# Patient Record
Sex: Female | Born: 1935 | Race: Black or African American | Hispanic: No
Health system: Southern US, Community
[De-identification: ages and names within clinical notes are randomized; demographics above are authoritative.]

---

## 2003-09-25 ENCOUNTER — Other Ambulatory Visit: Payer: Self-pay

## 2004-02-11 ENCOUNTER — Other Ambulatory Visit: Payer: Self-pay

## 2005-06-05 ENCOUNTER — Other Ambulatory Visit: Payer: Self-pay

## 2005-06-05 ENCOUNTER — Emergency Department: Payer: Self-pay | Admitting: Emergency Medicine

## 2005-08-06 ENCOUNTER — Ambulatory Visit: Payer: Self-pay | Admitting: Internal Medicine

## 2005-10-24 ENCOUNTER — Other Ambulatory Visit: Payer: Self-pay

## 2005-10-25 ENCOUNTER — Inpatient Hospital Stay: Payer: Self-pay | Admitting: Internal Medicine

## 2005-11-30 ENCOUNTER — Other Ambulatory Visit: Payer: Self-pay

## 2005-11-30 ENCOUNTER — Inpatient Hospital Stay: Payer: Self-pay | Admitting: Internal Medicine

## 2006-01-04 ENCOUNTER — Other Ambulatory Visit: Payer: Self-pay

## 2006-01-04 ENCOUNTER — Inpatient Hospital Stay: Payer: Self-pay | Admitting: Internal Medicine

## 2006-03-06 ENCOUNTER — Ambulatory Visit: Payer: Self-pay | Admitting: Family Medicine

## 2006-05-01 ENCOUNTER — Ambulatory Visit: Payer: Self-pay | Admitting: Family Medicine

## 2006-05-04 ENCOUNTER — Other Ambulatory Visit: Payer: Self-pay

## 2006-05-04 ENCOUNTER — Inpatient Hospital Stay: Payer: Self-pay | Admitting: Internal Medicine

## 2006-10-04 ENCOUNTER — Ambulatory Visit: Payer: Self-pay | Admitting: Family Medicine

## 2006-10-08 ENCOUNTER — Other Ambulatory Visit: Payer: Self-pay

## 2006-10-08 ENCOUNTER — Inpatient Hospital Stay: Payer: Self-pay | Admitting: *Deleted

## 2006-12-23 ENCOUNTER — Ambulatory Visit: Payer: Self-pay | Admitting: Family Medicine

## 2007-02-03 ENCOUNTER — Emergency Department: Payer: Self-pay | Admitting: Internal Medicine

## 2007-02-03 ENCOUNTER — Ambulatory Visit: Payer: Self-pay | Admitting: Family Medicine

## 2007-02-03 ENCOUNTER — Other Ambulatory Visit: Payer: Self-pay

## 2007-02-04 ENCOUNTER — Ambulatory Visit: Payer: Self-pay | Admitting: Family Medicine

## 2007-06-10 ENCOUNTER — Ambulatory Visit: Payer: Self-pay | Admitting: Family Medicine

## 2007-06-15 ENCOUNTER — Other Ambulatory Visit: Payer: Self-pay

## 2007-06-16 ENCOUNTER — Inpatient Hospital Stay: Payer: Self-pay | Admitting: Internal Medicine

## 2007-08-04 ENCOUNTER — Emergency Department: Payer: Self-pay | Admitting: Emergency Medicine

## 2007-09-09 ENCOUNTER — Emergency Department: Payer: Self-pay | Admitting: Emergency Medicine

## 2007-09-18 ENCOUNTER — Inpatient Hospital Stay: Payer: Self-pay | Admitting: Internal Medicine

## 2007-09-19 ENCOUNTER — Other Ambulatory Visit: Payer: Self-pay

## 2008-09-12 ENCOUNTER — Inpatient Hospital Stay: Payer: Self-pay | Admitting: Internal Medicine

## 2009-04-26 ENCOUNTER — Inpatient Hospital Stay: Payer: Self-pay | Admitting: Internal Medicine

## 2009-06-21 ENCOUNTER — Ambulatory Visit: Payer: Self-pay | Admitting: Family Medicine

## 2011-04-26 ENCOUNTER — Emergency Department: Payer: Self-pay | Admitting: Emergency Medicine

## 2011-11-06 ENCOUNTER — Ambulatory Visit: Payer: Self-pay | Admitting: Internal Medicine

## 2011-11-08 ENCOUNTER — Emergency Department: Payer: Self-pay | Admitting: Surgery

## 2011-11-13 ENCOUNTER — Inpatient Hospital Stay: Payer: Self-pay | Admitting: Internal Medicine

## 2011-11-13 LAB — COMPREHENSIVE METABOLIC PANEL
Anion Gap: 13 (ref 7–16)
Calcium, Total: 8.2 mg/dL — ABNORMAL LOW (ref 8.5–10.1)
Chloride: 101 mmol/L (ref 98–107)
Co2: 19 mmol/L — ABNORMAL LOW (ref 21–32)
Creatinine: 5.55 mg/dL — ABNORMAL HIGH (ref 0.60–1.30)
EGFR (African American): 10 — ABNORMAL LOW
EGFR (Non-African Amer.): 8 — ABNORMAL LOW
Potassium: 5.5 mmol/L — ABNORMAL HIGH (ref 3.5–5.1)
SGOT(AST): 34 U/L (ref 15–37)
SGPT (ALT): 24 U/L

## 2011-11-13 LAB — CBC
HCT: 26.1 % — ABNORMAL LOW (ref 35.0–47.0)
HGB: 8.5 g/dL — ABNORMAL LOW (ref 12.0–16.0)
MCH: 31.4 pg (ref 26.0–34.0)
MCV: 97 fL (ref 80–100)
RBC: 2.71 10*6/uL — ABNORMAL LOW (ref 3.80–5.20)
WBC: 6.9 10*3/uL (ref 3.6–11.0)

## 2011-11-13 LAB — TROPONIN I: Troponin-I: 0.04 ng/mL

## 2011-11-13 LAB — PROTIME-INR: INR: 1.2

## 2011-11-14 LAB — COMPREHENSIVE METABOLIC PANEL
Albumin: 3 g/dL — ABNORMAL LOW (ref 3.4–5.0)
BUN: 82 mg/dL — ABNORMAL HIGH (ref 7–18)
Bilirubin,Total: 0.2 mg/dL (ref 0.2–1.0)
Chloride: 104 mmol/L (ref 98–107)
Co2: 19 mmol/L — ABNORMAL LOW (ref 21–32)
Creatinine: 5.04 mg/dL — ABNORMAL HIGH (ref 0.60–1.30)
EGFR (African American): 11 — ABNORMAL LOW
EGFR (Non-African Amer.): 9 — ABNORMAL LOW
Glucose: 76 mg/dL (ref 65–99)
SGOT(AST): 35 U/L (ref 15–37)
SGPT (ALT): 25 U/L
Total Protein: 6.5 g/dL (ref 6.4–8.2)

## 2011-11-14 LAB — TROPONIN I
Troponin-I: 0.3 ng/mL — ABNORMAL HIGH
Troponin-I: 0.21 ng/mL — ABNORMAL HIGH

## 2011-11-14 LAB — BASIC METABOLIC PANEL
Anion Gap: 10 (ref 7–16)
BUN: 73 mg/dL — ABNORMAL HIGH (ref 7–18)
Calcium, Total: 7.8 mg/dL — ABNORMAL LOW (ref 8.5–10.1)
Co2: 23 mmol/L (ref 21–32)
Creatinine: 4.34 mg/dL — ABNORMAL HIGH (ref 0.60–1.30)
EGFR (African American): 13 — ABNORMAL LOW
Osmolality: 295 (ref 275–301)

## 2011-11-14 LAB — CBC WITH DIFFERENTIAL/PLATELET
Basophil %: 0.1 %
Eosinophil #: 0.1 10*3/uL (ref 0.0–0.7)
Eosinophil %: 1.2 %
HGB: 8.6 g/dL — ABNORMAL LOW (ref 12.0–16.0)
Lymphocyte #: 1.1 10*3/uL (ref 1.0–3.6)
MCH: 31.3 pg (ref 26.0–34.0)
MCHC: 32.6 g/dL (ref 32.0–36.0)
MCV: 96 fL (ref 80–100)
Monocyte #: 0.5 10*3/uL (ref 0.0–0.7)
Monocyte %: 7.4 %
Neutrophil #: 4.6 10*3/uL (ref 1.4–6.5)
Neutrophil %: 73.7 %
RBC: 2.76 10*6/uL — ABNORMAL LOW (ref 3.80–5.20)
WBC: 6.2 10*3/uL (ref 3.6–11.0)

## 2011-11-14 LAB — URINALYSIS, COMPLETE
Bacteria: NONE SEEN
Glucose,UR: NEGATIVE mg/dL (ref 0–75)
Leukocyte Esterase: NEGATIVE
Nitrite: NEGATIVE
Ph: 5 (ref 4.5–8.0)
Protein: NEGATIVE
RBC,UR: 1 /HPF (ref 0–5)
Squamous Epithelial: 5

## 2011-11-14 LAB — MAGNESIUM: Magnesium: 2.7 mg/dL — ABNORMAL HIGH

## 2011-11-14 LAB — PROTIME-INR
INR: 1.1
Prothrombin Time: 14.6 secs (ref 11.5–14.7)

## 2011-11-14 LAB — SODIUM, URINE, RANDOM: Sodium, Urine Random: 50 mmol/L (ref 20–110)

## 2011-11-14 LAB — CK TOTAL AND CKMB (NOT AT ARMC)
CK, Total: 615 U/L — ABNORMAL HIGH (ref 21–215)
CK-MB: 18.1 ng/mL — ABNORMAL HIGH (ref 0.5–3.6)
CK-MB: 17.1 ng/mL — ABNORMAL HIGH (ref 0.5–3.6)

## 2011-11-14 LAB — IRON AND TIBC
Iron Bind.Cap.(Total): 229 ug/dL — ABNORMAL LOW (ref 250–450)
Iron: 29 ug/dL — ABNORMAL LOW (ref 50–170)

## 2011-11-14 LAB — TSH: Thyroid Stimulating Horm: 0.153 u[IU]/mL — ABNORMAL LOW

## 2011-11-14 LAB — RETICULOCYTES: Reticulocyte: 0.9 % (ref 0.5–1.5)

## 2011-11-14 LAB — PROTEIN / CREATININE RATIO, URINE: Creatinine, Urine: 71.6 mg/dL (ref 30.0–125.0)

## 2011-11-15 LAB — CBC WITH DIFFERENTIAL/PLATELET
Basophil %: 0.2 %
Eosinophil %: 6 %
HGB: 9.1 g/dL — ABNORMAL LOW (ref 12.0–16.0)
Lymphocyte #: 1.1 10*3/uL (ref 1.0–3.6)
Lymphocyte %: 19.3 %
MCH: 30.9 pg (ref 26.0–34.0)
MCV: 95 fL (ref 80–100)
Monocyte #: 0.7 10*3/uL (ref 0.0–0.7)
Monocyte %: 12.5 %
Platelet: 170 10*3/uL (ref 150–440)
RBC: 2.94 10*6/uL — ABNORMAL LOW (ref 3.80–5.20)
WBC: 5.6 10*3/uL (ref 3.6–11.0)

## 2011-11-15 LAB — CK: CK, Total: 524 U/L — ABNORMAL HIGH (ref 21–215)

## 2011-11-15 LAB — BASIC METABOLIC PANEL
Anion Gap: 10 (ref 7–16)
BUN: 65 mg/dL — ABNORMAL HIGH (ref 7–18)
Chloride: 100 mmol/L (ref 98–107)
Co2: 26 mmol/L (ref 21–32)
Creatinine: 3.98 mg/dL — ABNORMAL HIGH (ref 0.60–1.30)
EGFR (African American): 14 — ABNORMAL LOW
EGFR (Non-African Amer.): 12 — ABNORMAL LOW
Sodium: 136 mmol/L (ref 136–145)

## 2011-11-15 LAB — PHOSPHORUS: Phosphorus: 4.7 mg/dL (ref 2.5–4.9)

## 2011-11-16 LAB — BASIC METABOLIC PANEL
Anion Gap: 8 (ref 7–16)
BUN: 55 mg/dL — ABNORMAL HIGH (ref 7–18)
Calcium, Total: 8.2 mg/dL — ABNORMAL LOW (ref 8.5–10.1)
Co2: 29 mmol/L (ref 21–32)
EGFR (Non-African Amer.): 14 — ABNORMAL LOW
Glucose: 116 mg/dL — ABNORMAL HIGH (ref 65–99)
Osmolality: 295 (ref 275–301)
Sodium: 140 mmol/L (ref 136–145)

## 2011-11-16 LAB — HEMOGLOBIN: HGB: 8.4 g/dL — ABNORMAL LOW (ref 12.0–16.0)

## 2011-11-16 LAB — UR PROT ELECTROPHORESIS, URINE RANDOM

## 2011-11-17 LAB — CBC WITH DIFFERENTIAL/PLATELET
Bands: 1 %
Eosinophil: 8 %
HCT: 25.3 % — ABNORMAL LOW (ref 35.0–47.0)
HGB: 8 g/dL — ABNORMAL LOW (ref 12.0–16.0)
Lymphocytes: 30 %
MCH: 30.8 pg (ref 26.0–34.0)
MCHC: 31.7 g/dL — ABNORMAL LOW (ref 32.0–36.0)
Platelet: 152 10*3/uL (ref 150–440)
RDW: 16.4 % — ABNORMAL HIGH (ref 11.5–14.5)
Variant Lymphocyte - H1-Rlymph: 1 %

## 2011-11-17 LAB — BASIC METABOLIC PANEL
Anion Gap: 7 (ref 7–16)
BUN: 45 mg/dL — ABNORMAL HIGH (ref 7–18)
Calcium, Total: 8 mg/dL — ABNORMAL LOW (ref 8.5–10.1)
Chloride: 107 mmol/L (ref 98–107)
Co2: 28 mmol/L (ref 21–32)
Osmolality: 295 (ref 275–301)
Potassium: 5.2 mmol/L — ABNORMAL HIGH (ref 3.5–5.1)

## 2011-11-19 LAB — BASIC METABOLIC PANEL
BUN: 32 mg/dL — ABNORMAL HIGH (ref 7–18)
Calcium, Total: 8.1 mg/dL — ABNORMAL LOW (ref 8.5–10.1)
Chloride: 107 mmol/L (ref 98–107)
Co2: 27 mmol/L (ref 21–32)
Osmolality: 288 (ref 275–301)
Potassium: 5.3 mmol/L — ABNORMAL HIGH (ref 3.5–5.1)
Sodium: 141 mmol/L (ref 136–145)

## 2011-11-19 LAB — COMPREHENSIVE METABOLIC PANEL
Albumin: 2.4 g/dL — ABNORMAL LOW (ref 3.4–5.0)
Alkaline Phosphatase: 56 U/L (ref 50–136)
BUN: 33 mg/dL — ABNORMAL HIGH (ref 7–18)
Chloride: 106 mmol/L (ref 98–107)
Co2: 28 mmol/L (ref 21–32)
Creatinine: 2.41 mg/dL — ABNORMAL HIGH (ref 0.60–1.30)
EGFR (African American): 25 — ABNORMAL LOW
Glucose: 109 mg/dL — ABNORMAL HIGH (ref 65–99)
Osmolality: 282 (ref 275–301)
Potassium: 5.3 mmol/L — ABNORMAL HIGH (ref 3.5–5.1)
SGPT (ALT): 18 U/L
Sodium: 137 mmol/L (ref 136–145)
Total Protein: 5.9 g/dL — ABNORMAL LOW (ref 6.4–8.2)

## 2011-11-19 LAB — CULTURE, BLOOD (SINGLE)

## 2011-11-20 LAB — HEMOGLOBIN: HGB: 8.5 g/dL — ABNORMAL LOW (ref 12.0–16.0)

## 2011-11-20 LAB — BASIC METABOLIC PANEL
BUN: 31 mg/dL — ABNORMAL HIGH (ref 7–18)
Chloride: 107 mmol/L (ref 98–107)
Creatinine: 2.18 mg/dL — ABNORMAL HIGH (ref 0.60–1.30)
EGFR (African American): 28 — ABNORMAL LOW
EGFR (Non-African Amer.): 23 — ABNORMAL LOW
Glucose: 112 mg/dL — ABNORMAL HIGH (ref 65–99)
Potassium: 4.6 mmol/L (ref 3.5–5.1)
Sodium: 142 mmol/L (ref 136–145)

## 2011-11-24 ENCOUNTER — Emergency Department: Payer: Self-pay | Admitting: Emergency Medicine

## 2011-12-04 ENCOUNTER — Ambulatory Visit: Payer: Self-pay | Admitting: Internal Medicine

## 2012-07-05 DEATH — deceased

## 2013-02-03 IMAGING — CR DG CHEST 1V PORT
1 series · 1 of 1 positions shown · non-contrast
Comparison: none

REASON FOR EXAM: post central line
COMMENTS:

[portable]
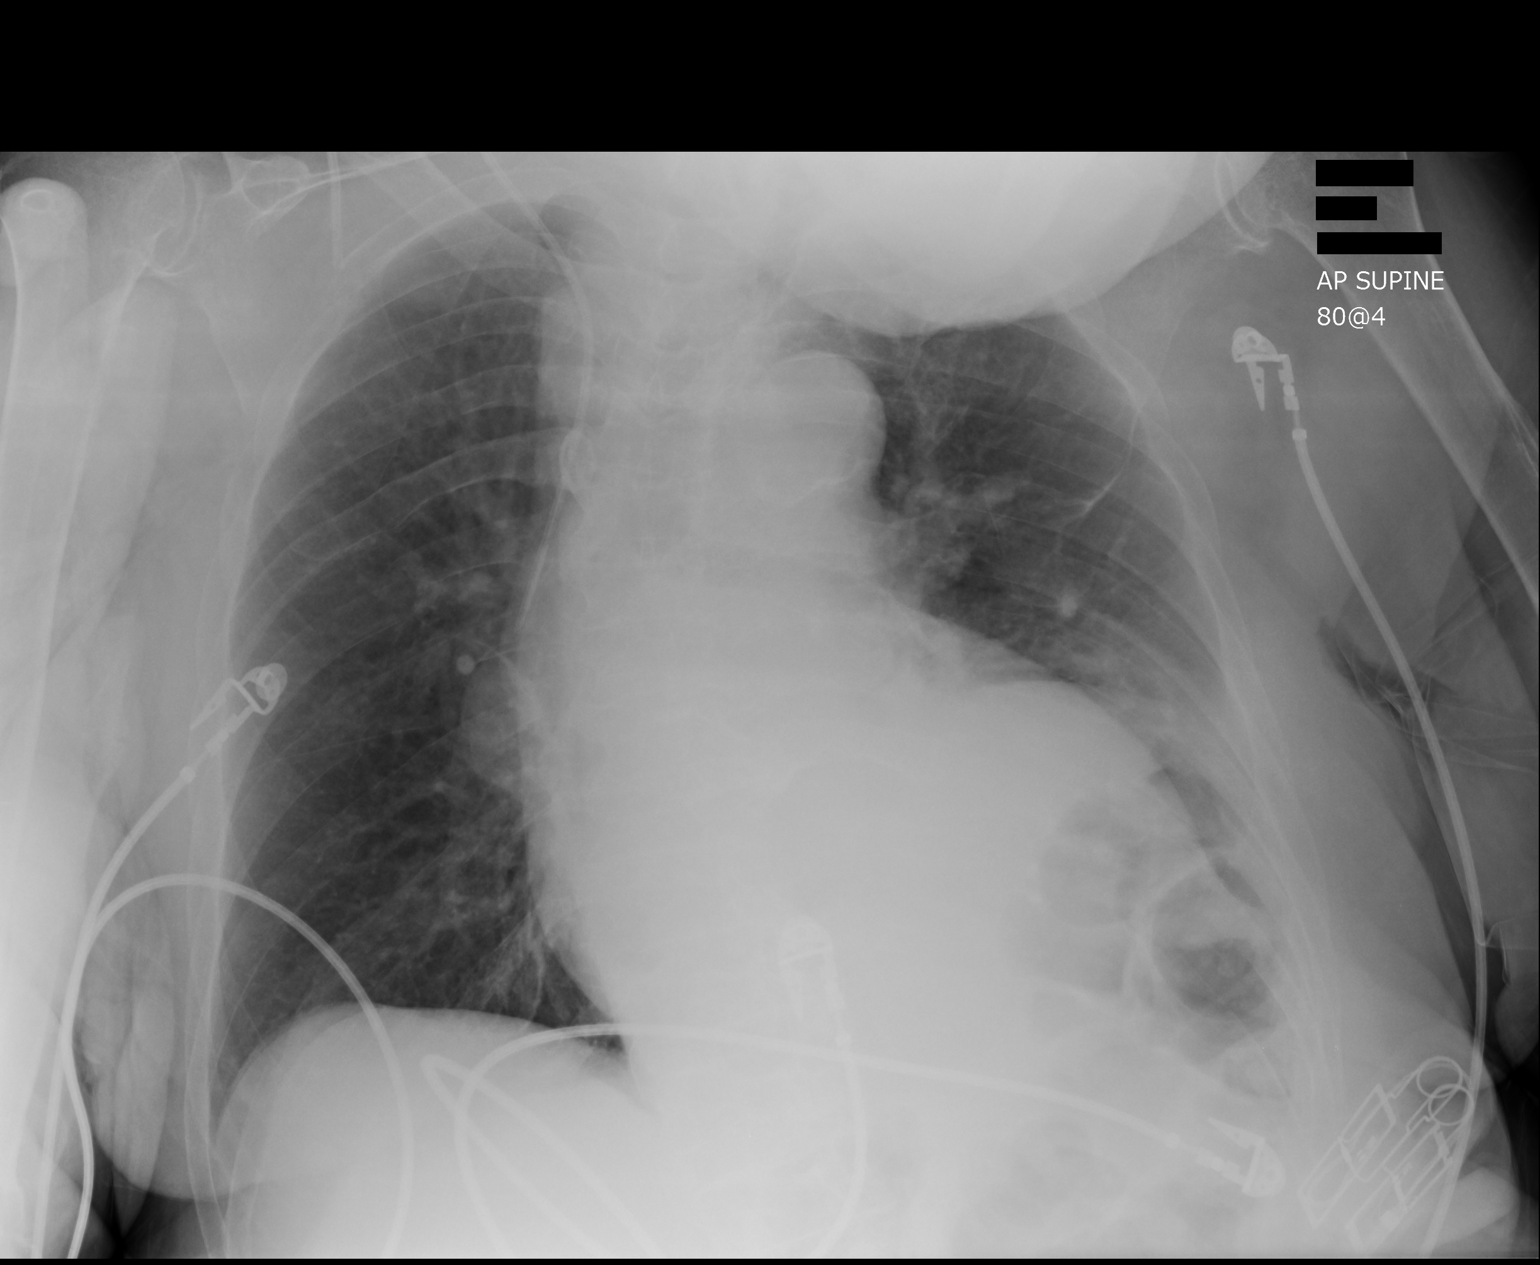

[1 of 1 positions shown; findings below may reference images not displayed]

PROCEDURE:     DXR - DXR PORTABLE CHEST SINGLE VIEW  - November 13, 2011 [DATE]

RESULT:     Comparison is made to the study 26 April, 2011. The patient has
undergone interval placement of a right internal jugular venous catheter.
The tip of the catheter lies in the region of the midportion of the SVC. I
see no post procedure complication. The right lung is well-expanded and
clear. On the left there is patchy increased density adjacent to the
elevated hemidiaphragm suggesting subsegmental atelectasis. There is chronic
mild enlargement of the cardiac silhouette.
IMPRESSION: I see no post procedure complication following placement of
the right internal jugular venous catheter.

## 2013-02-04 IMAGING — CT CT ABD-PELV W/O CM
1 of 2 series · 15 of 32 positions shown, 19 images · non-contrast
Comparison: none

REASON FOR EXAM: (1) recent replacement of PEG, bnow renal failure witgh
R sided guarding; (2) Mako Qali
COMMENTS:

[Series 2: 3mm soft tissue · axial · 0.80mm/px · z∈[-556,-148]mm · 15 of 150 slices shown, 19 images]
[im 7/150  soft-tissue]
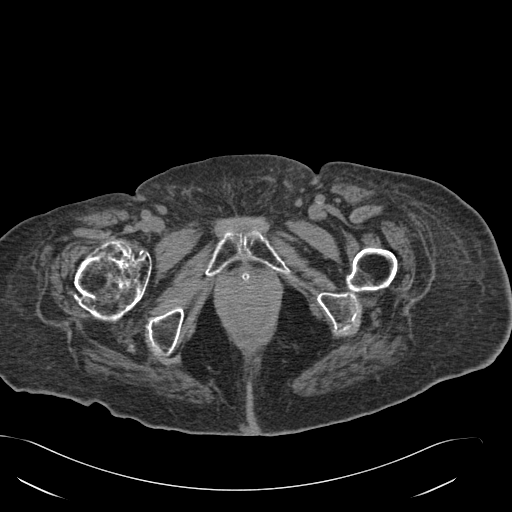
[im 7/150  bone]
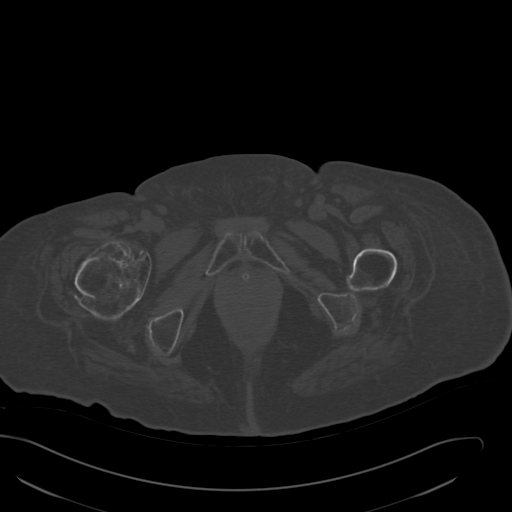
[im 20/150  soft-tissue]
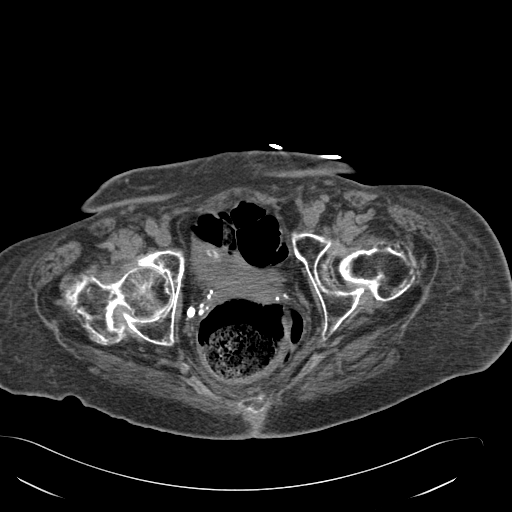
[im 33/150  soft-tissue]
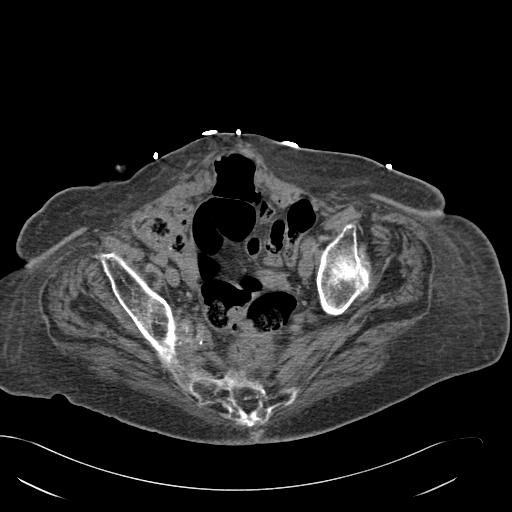
[im 39/150  soft-tissue]
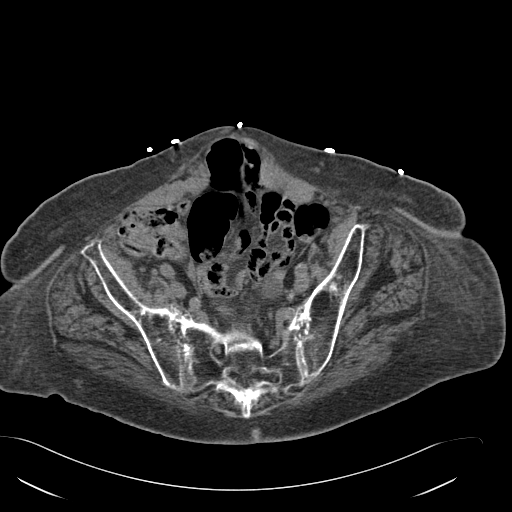
[im 52/150  soft-tissue]
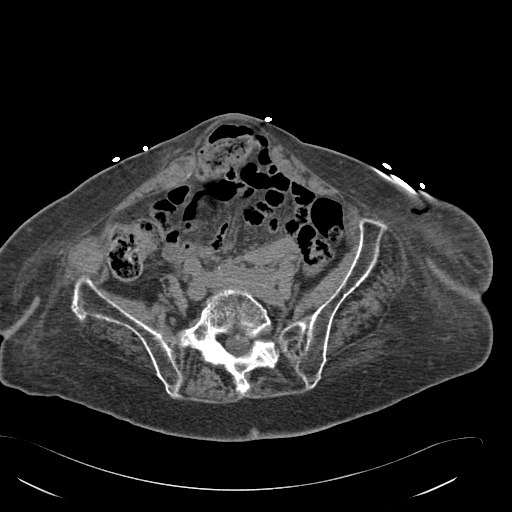
[im 65/150  soft-tissue]
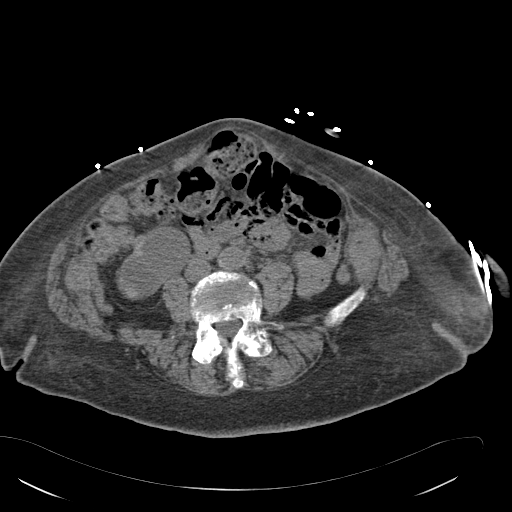
[im 78/150  soft-tissue]
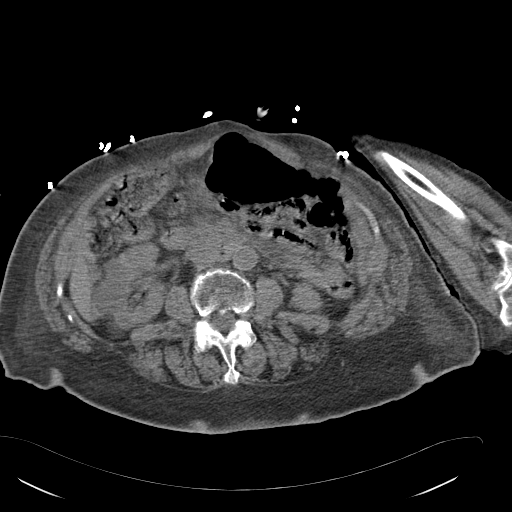
[im 85/150  soft-tissue]
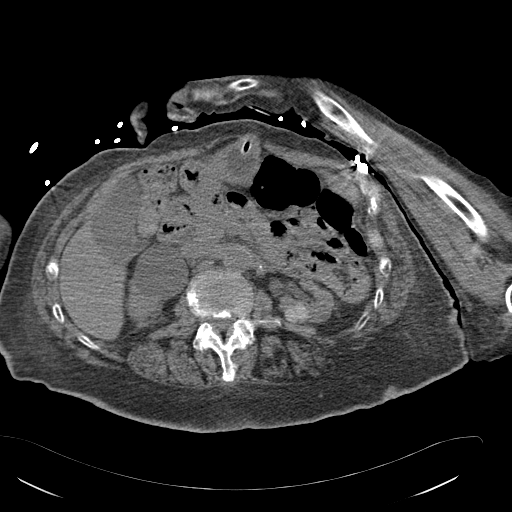
[im 98/150  soft-tissue]
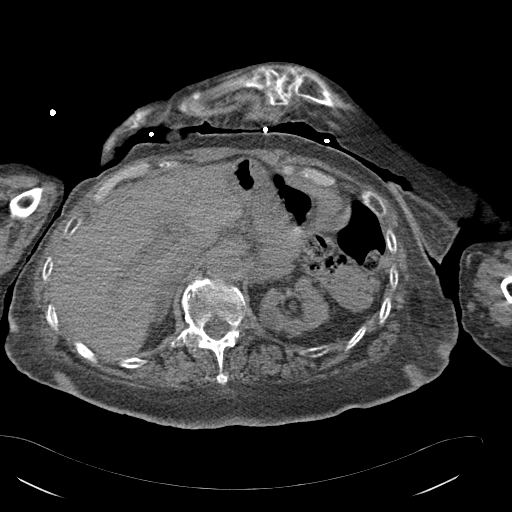
[im 98/150  bone]
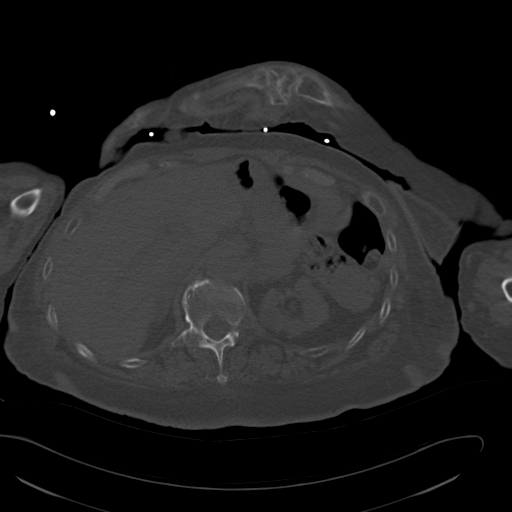
[im 111/150  soft-tissue]
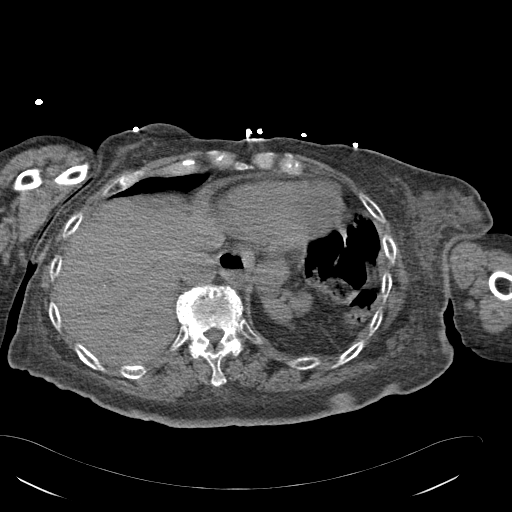
[im 117/150  soft-tissue]
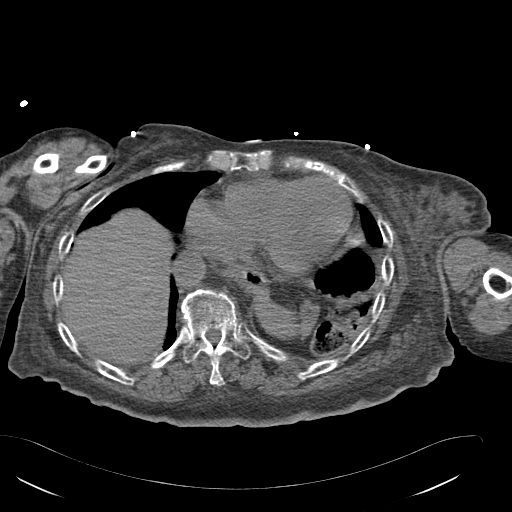
[im 124/150  lung]
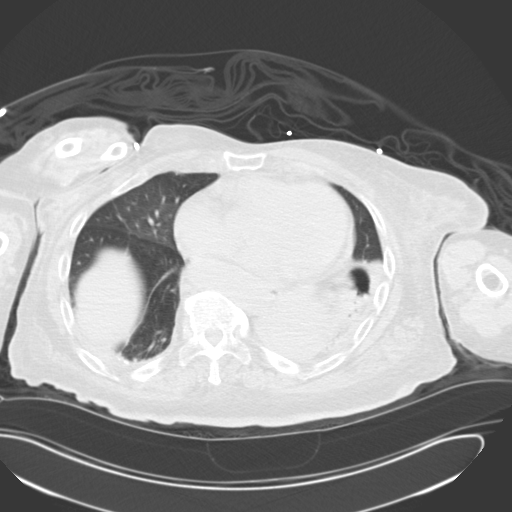
[im 130/150  soft-tissue]
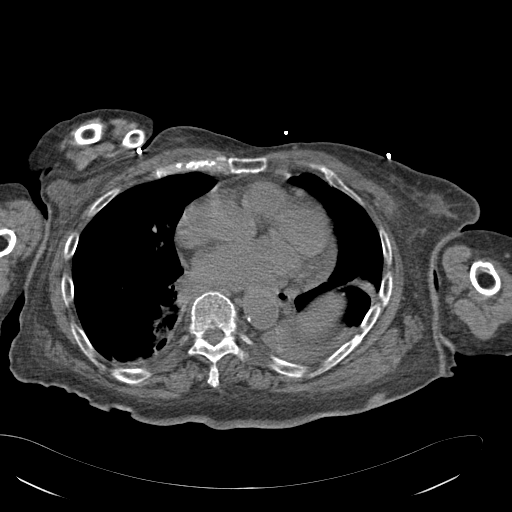
[im 130/150  lung]
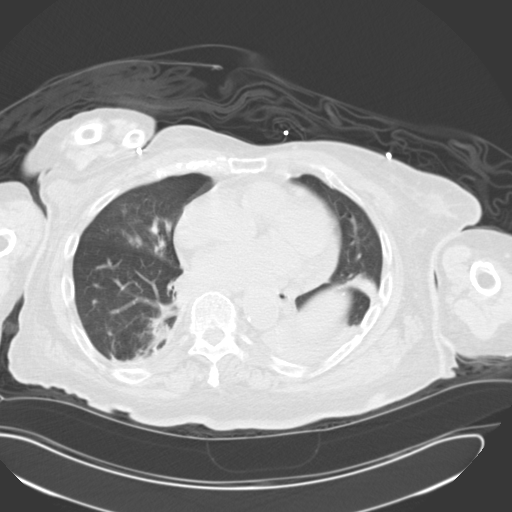
[im 137/150  lung]
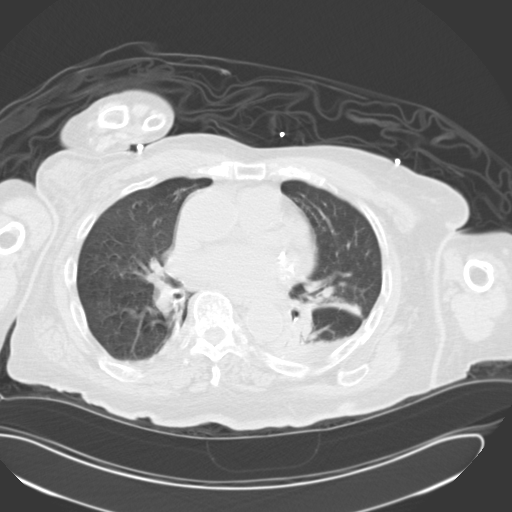
[im 143/150  soft-tissue]
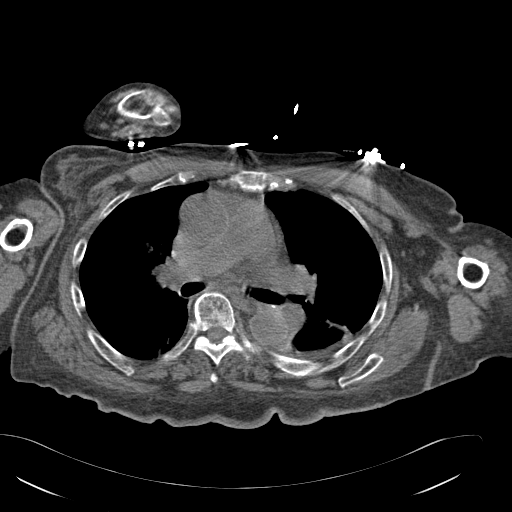
[im 143/150  lung]
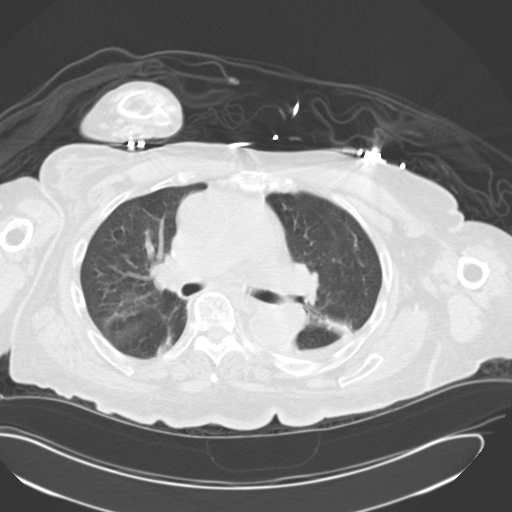

[15 of 32 positions shown; findings below may reference images not displayed]

PROCEDURE:     CT  - CT ABDOMEN AND PELVIS W[DATE] [DATE]

RESULT:     Axial noncontrast CT scanning was performed through the abdomen
and pelvis with reconstructions at 3 mm intervals and slice thicknesses.

There is basilar infiltrate on the left with a small left pleural effusion.
There is a small amount of atelectasis on the right. The cardiac chambers
are enlarged. The caliber of the lower thoracic and of the abdominal aorta
is normal. The PEG balloon is in the region of the distal gastric lumen. The
stomach is nondistended. There is no contrast in the small or large bowel
and thus it is difficult to comment more than fairly globally on the
appearance of the bowel. There is a broadly necked lower abdominal and upper
pelvis ventral hernia containing some small and large bowel loops without
evidence of obstruction. There is a moderate amount of stool within the
rectum. The urinary bladder is decompressed with a Foley catheter.

The left kidney is atrophic. There are multiple hypo- and hyperdense foci
involving both kidneys which likely reflect cysts. I see no hydronephrosis.
The gallbladder is adequately distended with no evidence of calcified
stones. No acute abnormality of the liver or pancreas or spleen is
demonstrated. I see no definite adrenal masses.

There ismild loss of height of the body of L4.
IMPRESSION: 1. The study is very limited without IV and oral contrast material.
2. There is a small left pleural effusion. There is left basilar atelectasis
or pneumonia posteriorly. Minimal right basilar atelectasis is present.
There is enlargement of the cardiac chambers.
3. I do not see evidence of bowel obstruction or ileus. There is a moderate
amount of gas and stool in the. The gastrostomy tube appears to be in
reasonable position.
4. I do not see objective evidence of acute hepatobiliary abnormality nor
acute urinary tract abnormality. Multiple hypo and hyperdense masses
associated with the kidneys likely reflect cysts.

## 2013-02-04 IMAGING — CT CT HEAD WITHOUT CONTRAST
3 of 4 series · 17 of 30 positions shown, 19 images · non-contrast
Comparison: none

REASON FOR EXAM: lethargy altered mental status
COMMENTS:

[Series 3: without · axial · non-contrast · 0.45mm/px · z∈[-149,-24]mm · 6 of 36 slices shown, 8 images]
[im 6/36  brain]
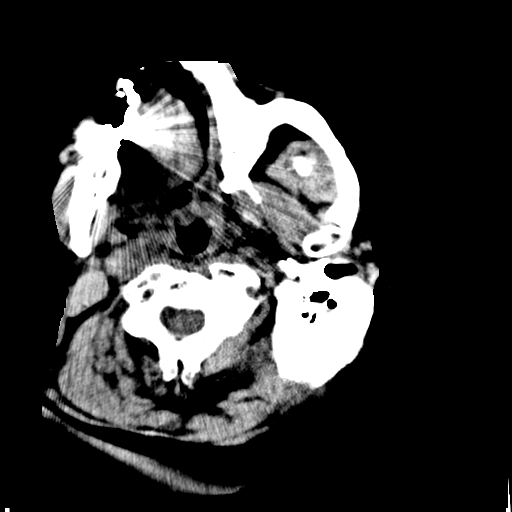
[im 6/36  bone]
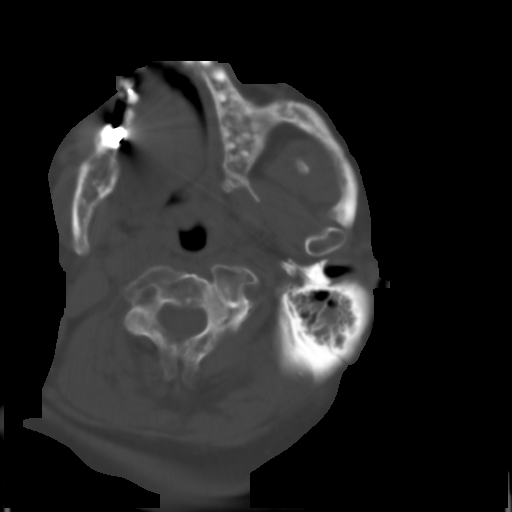
[im 11/36  brain]
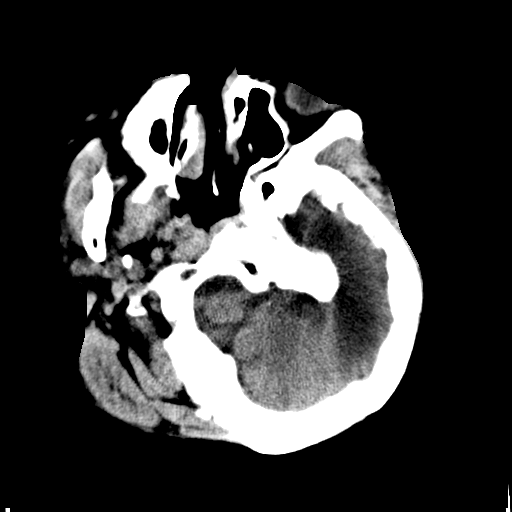
[im 16/36  brain]
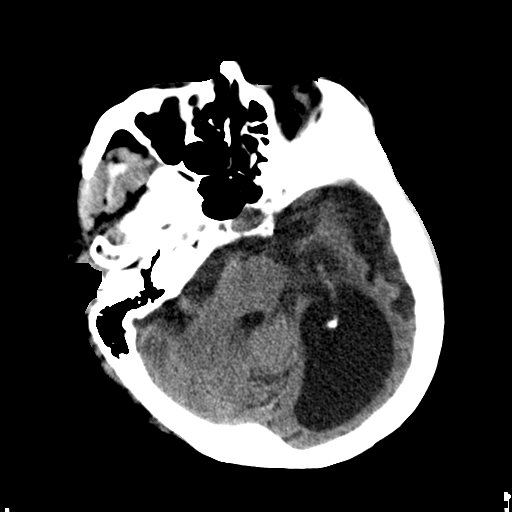
[im 21/36  brain]
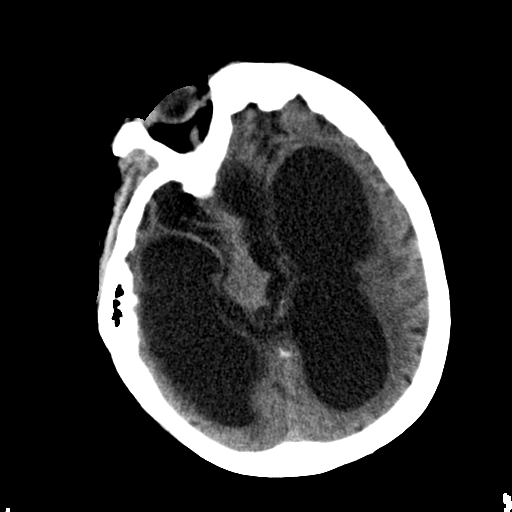
[im 26/36  brain]
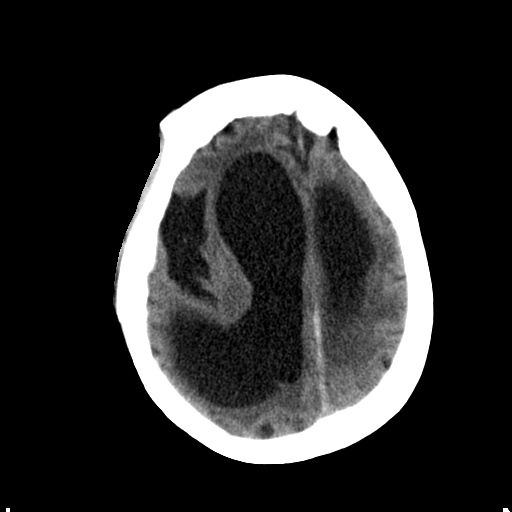
[im 26/36  bone]
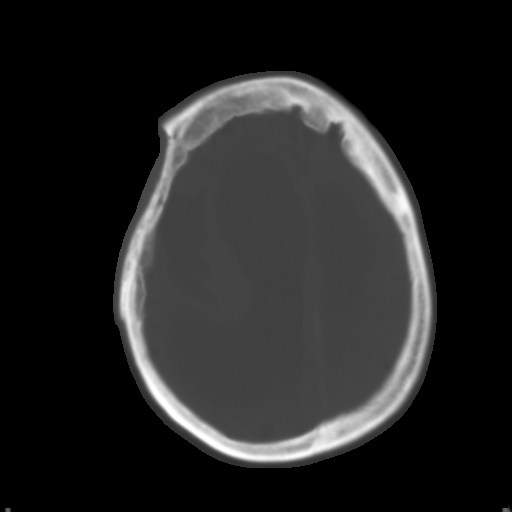
[im 31/36  brain]
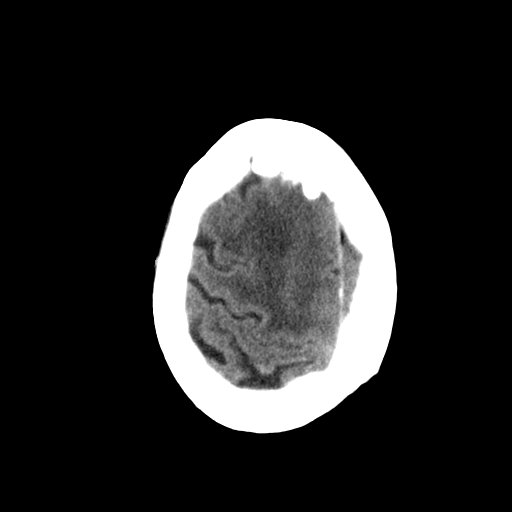

[Series 4: bone · axial · 0.45mm/px · z∈[-149,-24]mm · 6 of 37 slices shown]
[im 6/37  bone]
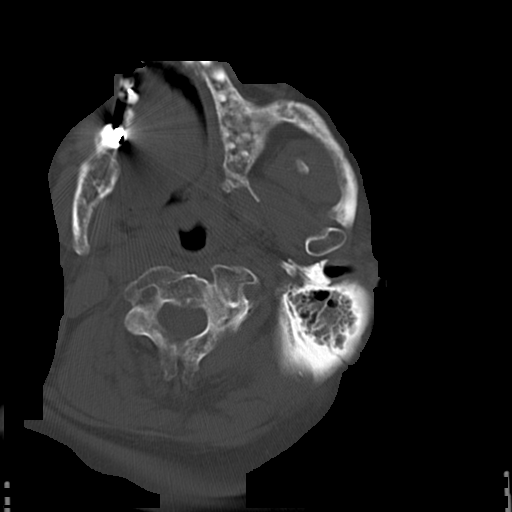
[im 11/37  bone]
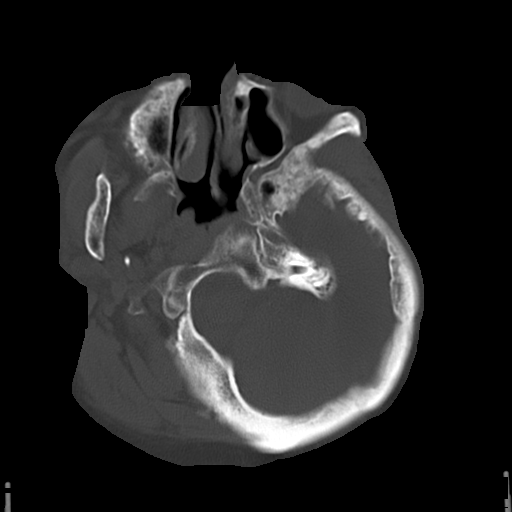
[im 16/37  bone]
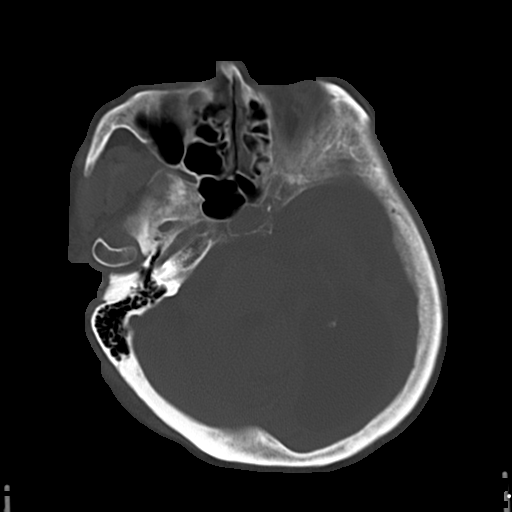
[im 21/37  bone]
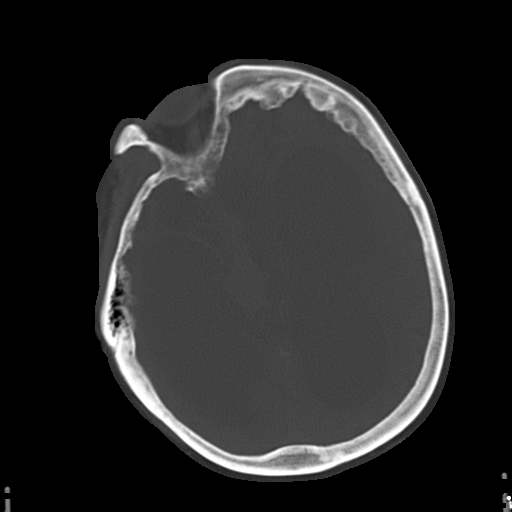
[im 26/37  bone]
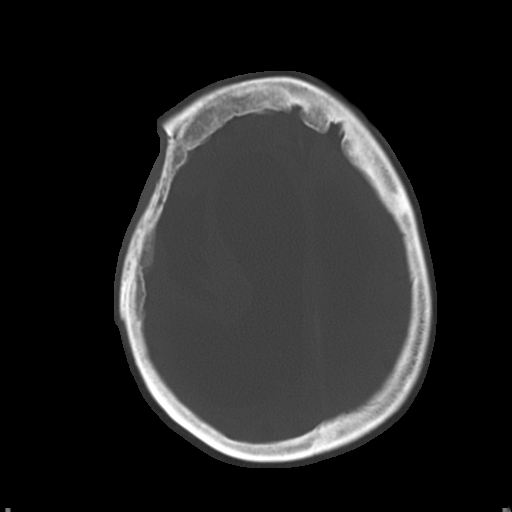
[im 31/37  bone]
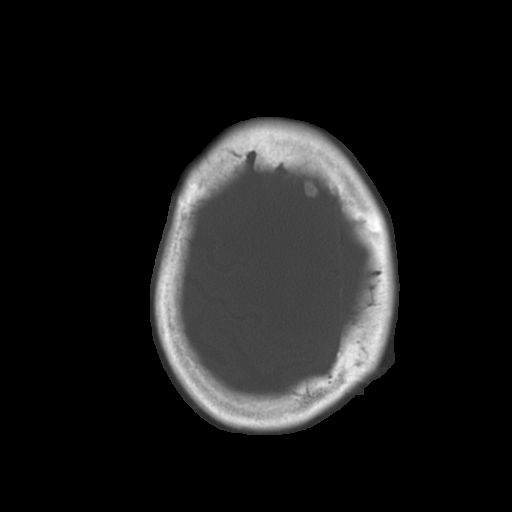

[Series 602: axial recons straighten · axial · 0.45mm/px · z∈[-65,+19]mm · 5 of 30 slices shown]
[im 5/30  brain]
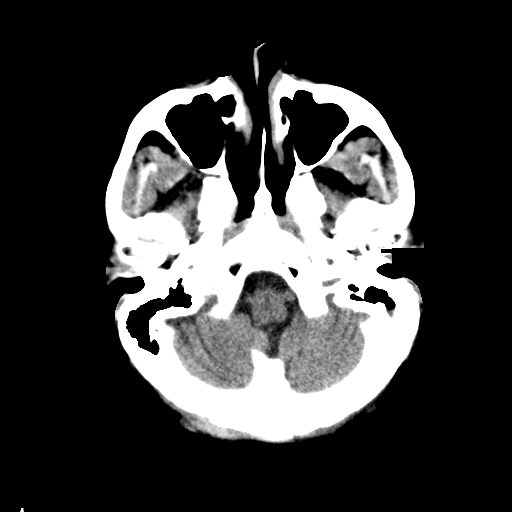
[im 10/30  brain]
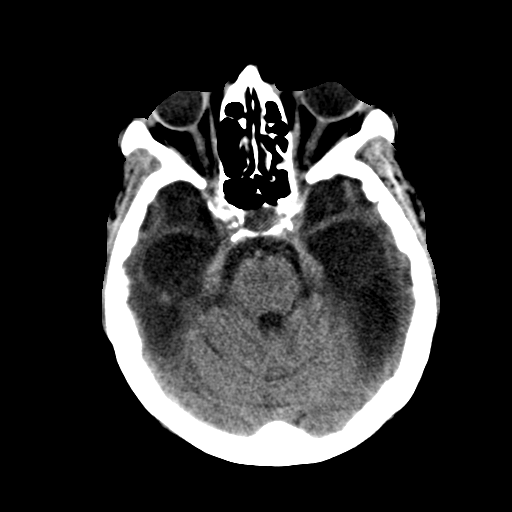
[im 15/30  brain]
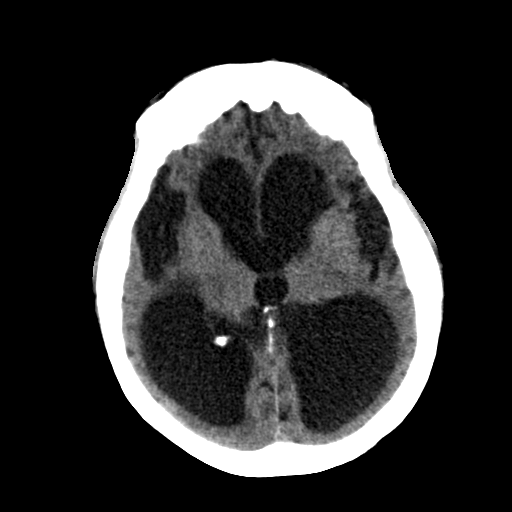
[im 20/30  brain]
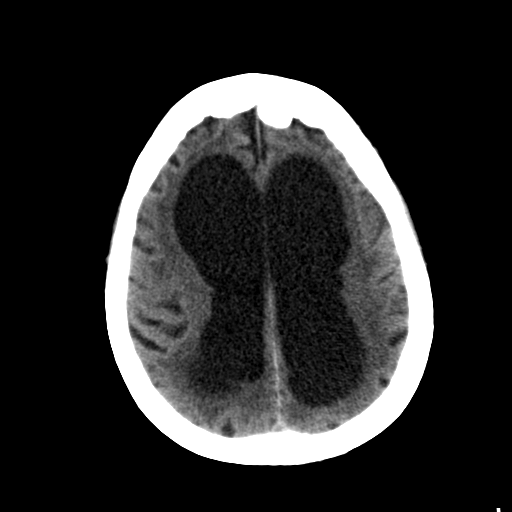
[im 25/30  brain]
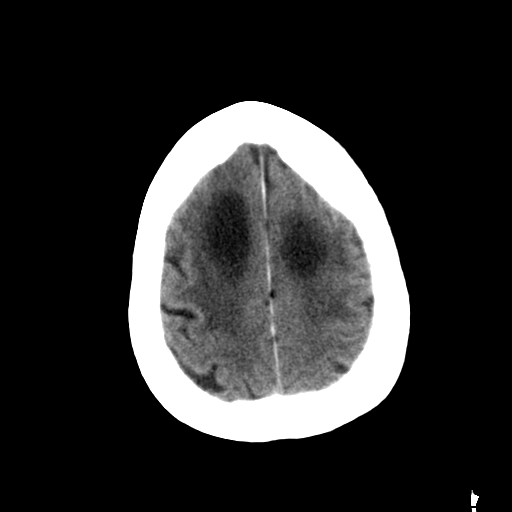

[17 of 30 positions shown; findings below may reference images not displayed]

PROCEDURE:     CT  - CT HEAD WITHOUT CONTRAST  - November 14, 2011  [DATE]

RESULT:     Axial noncontrast CT scanning was performed through the brain
with reconstructions at 3 mm intervals and slice thicknesses. Comparison
made to study of [REDACTED].

There remains severe hydrocephalus of the lateral and third ventricles. The
fourth ventricle is normal in size. There is no shift of the midline. There
is no evidence of an acute intracranial hemorrhage nor of an evolving
ischemic event. The cerebellum and brainstem are normal in appearance. No
abnormal intracranial calcifications are demonstrated. At bone window
settings I do not see evidence of an acute skull fracture.
IMPRESSION: I do not see evidence of an acute ischemic or hemorrhagic
event. There is chronic severe hydrocephalus.

A preliminary report was sent to the floor at the conclusion of the study.

## 2015-01-27 NOTE — Discharge Summary (Signed)
PATIENT NAME:  Dana Acosta, Dana Acosta MR#:  [id number] DATE OF BIRTH:  1929-03-24  DATE OF ADMISSION:  11/13/2011 DATE OF DISCHARGE:  11/20/2011  ADMITTING DIAGNOSES: Hypoglycemia, acute renal failure.   DISCHARGE DIAGNOSES:  1. Systemic antiinflammatory response action. 2. Obstructive pneumonia. 3. Hypotension. 4. Hypoglycemia.  5. Septic shock.  6. Elevated troponin, but no acute coronary syndrome, likely demand ischemia due to hypotension.  7. Acute renal failure, resolving.  8. Hyperkalemia due to acute renal failure.  9. Metabolic acidosis due to acute renal failure.  10. Anemia.  11. Advanced dementia.  12. Dysphagia.  13. Status post PEG tube replacement, on 11/08/2011.  14. Diarrhea, Clostridium difficile negative.  15. History of peptic ulcer disease.  16. Hypertension.  17. Frequent urinary tract infections. 18. Hydrocephalus. 19. Malnutrition. 20. Generalized debility. 21. Bedridden status for at least 9 years.   DISCHARGE CONDITION: Guarded however stable.     DISCHARGE MEDICATIONS: She is to resume her outpatient medications which include the following.  1. Vitamin D3 50,000 unit oral capsules once monthly. 2. Norvasc 5 mg per peg daily.  3. Prevacid SolTab 30 mg per peg daily.  4. Robinul 1 mg per peg once daily.  ADDITIONAL MEDICATIONS:  1. Levaquin 500 mg per peg daily for 10 more days.  2. Augmentin 500 mg per peg twice daily for 10 more days.  3. Scopolamine patch topically every three days as needed to decrease oral secretions.   NOTE: The patient is not to take lisinopril unless directed by primary care physician.   HOME OXYGEN: None.   DIET: Jevity 1.5 calorie RTH 40 mL/hour continuous drip with 50 mL/hour water flushes until diarrhea subsides, then water flushes should be decreased to 25 mL/hour. The patient is to have head of bed elevated more than 30 degrees at all times during feedings. Hold tube feeds if residuals are more than 200 mL. Recheck every  2 hours and then restart when residuals are less than 100 mL. If tolerated, titrate to goal rate of 40 mL after 12 hours.  ACTIVITY LIMITATIONS: As tolerated.   DISCHARGE FOLLOWUP: Followup with Dr. Lorie PhenixNancy Maloney in two days after discharge.  CONSULTANTS:  1. Munsoor Lateef, MD. 2. Care Management.  3. Benita Gutterobert Gittin, MD. 4. Ned GraceNancy Phifer, MD. 5. Mosetta PigeonHarmeet Singh, MD.  RADIOLOGIC STUDIES: Chest, portable single view, on 11/13/2011: Atelectasis adjacent to elevated left hemidiaphragm, otherwise no acute cardiopulmonary abnormality.   Chest x-ray, portable single view, on 11/13/2011, after central line placement: No postprocedure complications following placement of right internal jugular venous catheter.   CT of head without contrast on 11/14/2011: No evidence of acute ischemic or hemorrhagic event. There is chronic severe hydrocephalus.   CT of abdomen and pelvis without contrast on 11/14/2011: Study is very limited without IV or oral contrast material. There is small left pleural effusion. There is left basilar atelectasis or pneumonia posteriorly. Minimal right basilar atelectasis is present. There is enlargement of cardiac chambers. No evidence of bowel obstruction or ileus. There is moderate amount of gas and stool and the gastrostomy tube appears to be in reasonable position. No evidence of acute hepatobiliary abnormality and no acute urinary tract abnormality. Multiple hypodense and hyperdense masses associated with the kidneys, likely reflecting cysts, according to the radiologist.   Ultrasound of bilateral lower extremities on 11/17/2011: No evidence of thrombus within the right or left femoral or popliteal veins.   Repeated portable chest x-ray on 11/18/2011: stable cardiomegaly. No definite infiltrate at this time. There is a  very limited study given the contracted position of the patient and overlapping of extremities and soft tissues from the chin over the chest, according to the  radiologist.   HISTORY OF PRESENT ILLNESS: The patient is an 81 year old African American female with past medical history significant for history of advanced dementia and history of hydrocephalus who is bedridden and presented to the hospital with hypotension. Please refer to Dr. Rexene Edison Lateef's admission note on 11/13/2011.   On arrival to the emergency room, the patient's temperature was 96.8, pulse 63, respiration rate 18, and blood pressure was 75/52. Physical examination was remarkable for 2+ bilateral lower extremity edema, also remarkable that she was somewhat sluggish with dry mucosa. She had contracted upper extremities as well as lower extremities and had a PEG tube placed.   LABS/STUDIES: Glucose 29, on Accu-Chek, and glucose 34 on blood draw. The patient's BUN was 90, creatinine was 5.65, sodium 133, potassium 5.5, and bicarbonate level was 19. Estimated GFR for African-American was 10. Liver enzymes were unremarkable, except for albumin level of 3.1. The patient's troponin level was normal at 0.04. The patient's CBC showed a white blood cell count of 6.9, hemoglobin 8.5, and platelets 135. Coagulation panel revealed pro time of 16.0 and INR was 1.2.   ABGs were done on 21% FiO2 and showed pH of 7.31, pCO2 40, pO2 86, FiO2 of 21%, bicarbonate level 20.1, oxygen saturation 95.5, and base excess was -5.7. Lactic acid level was elevated at 1.4.   EKG showed sinus rhythm at 63 beats per minute, fusion complexes, minimally voltage criteria for left ventricular hypertrophy and no acute ST-T changes.   Chest x-ray was remarkable for atelectasis versus pneumonia initially.  HOSPITAL COURSE: The patient was started on antibiotic therapy and admitted to the Critical Care Unit. The patient's blood cultures came back negative, on 11/13/2011. Her urine cultures also were negative, on 11/14/2011. Her stool cultures were negative for C. difficile, on 11/15/2011. It was unclear why the patient had  significant systemic inflammatory response reaction with hypotension and hypoglycemia as well as acute renal failure. She received fluids as well as broad-spectrum antibiotics, as mentioned above. However, whenever her blood cultures came back negative, her antibiotics were discontinued with hopes that the patient would improve with IV fluids alone. However, with IV fluids alone she remained severely hypotensive requiring pressors for a prolonged period of time and required reinstitution of antibiotic therapy again. When the patient was restarted on Levaquin, as well as Zosyn, after CT scan of her chest, on 11/14/2011, came back with concerns for possible basilar atelectasis or pneumonia posteriorly, the patient's condition slowly improved.   First, in regards to systemic antiinflammatory response reaction, the patient had suspected pneumonia. She was continued on antibiotic therapy while in the hospital. She briefly had suspension of her antibiotics however reinstitution later on because congestion did not improve. With antibiotics her blood pressure improved, as well as her kidney function, as well as acidosis and hypoglycemia. The patient is to continue antibiotics for 10 more days to complete a 14 day course. She was noted to have elevated troponin level; however, that was felt to be due to demand ischemia due to hypotension, but not acute coronary syndrome. The patient's troponin was elevated only minimally at 0.30, on the second set, with minimal elevation of CK-MB fraction as well as CK total. The third set revealed down-going troponin to 0.21 versus CK-MB fraction of 17.1 down from 18.1, on the second set. It was felt however that  the patient's mild troponin elevation probably was hypotension as well as acute renal failure related, but not acute coronary syndrome. The patient was managed on aspirin as well as heparin while she was in the hospital and watched for troponin elevation. However, when her  troponin came back down so quickly, her heparin was changed to just subcutaneous and it was decreased appropriately. No cardiology consultation was obtained because of poor physical status of the patient herself and the feeling that the patient's elevation of troponin did not represent acute coronary syndrome.   In regards to acute renal failure, the patient received high dose of IV fluids as well as pressor support with Levophed and the patient's kidney function improved. Initially the patient's creatinine was 5.55. On the day of discharge, the patient's creatinine is 2.18, on 11/20/2011. It is recommended to continue and advance doses of fluids for the patient, especially now since she is having still continuous ongoing mild diarrhea. She will have more frequent flushes with higher water load. It is recommended to follow the patient's BUN and creatinine as well as her urine output to make sure that she has good urine output and to follow her creatinine levels as well to make sure that kidney function is improving.   The patient had hyperkalemia as well as metabolic acidosis, which was due to acute renal failure. It resolved when the patient's kidney function improved.   In regards to hypoglycemia, initially the patient required D5 water, however, later on the patient's D5 water was discontinued and she was able to maintain her blood glucose levels with nutrition alone. On the day of discharge, the patient's glucose levels are around 112.  Regarding anemia, the patient was evaluated by Dr. Lorre Nick and Dr. Lorre Nick felt that the patient's anemia is stable, at around 8, and possibly is related to chronic disease as well as azotemia and maybe even iron deficiency. He recommended to start the patient on iron if needed. He looked through studies which were done, serologic studies which were done, by the nephrologist. Electrophoresis showed light chains in the urine, which was slightly abnormal. However, Dr. Lorre Nick  felt that they were nonspecific as well as nondiagnostic. He recommended to possibly repeat the patient's CBC, as well as electrophoresis, every 3 or 4 months and make decisions further along. He did not feel that the patient has acute bleeding. However, guaiac stools where never checked, unfortunately. He also recommended to followup the patient's CBC in one week and then at intervals as well as iron studies and possibly supplement iron. The patient's iron studies were performed while she was here in the hospital. On 11/13/2011, the patient's iron level was found to be 29, iron saturation was 13, and ferritin level was found to be 81 telling me that very likely the patient has anemia of chronic disease, as well as possibly iron deficiency anemia. Iron supplementation should be instituted as an outpatient. The patient's hemoglobin level was checked while she was in the hospital and hemoglobin level was stable at 8.5, on the day of discharge, 11/20/2011.   In regards to diarrhea, which the patient had, it was of unclear etiology. C. difficile was checked and found to be negative. The patient still had continuous intermittent diarrhea. However, it was felt that the patient's diarrhea should be controlled conservatively. As it was subsiding, no medications were prescribed for her. However, the patient's caregivers were advised to continue higher water flushes until diarrhea subsides to make sure the patient has good peg  tube intake and she has been balancing her water supply and water loss.   In regards to peptic ulcer disease, the patient is to continue PPIs, as previously ordered.   In regards to hypotension, the patient's blood pressure readings were low initially on arrival to the hospital, however, they improved with IV fluid administration. We did not recommend to restart the patient on lisinopril at this time yet as the patient's blood pressure readings are low-normal.   On the day of discharge, her  temperature is 97.4, pulse 68, respiration rate 17 to 22, blood pressure 131/65, and saturation 100% on room air at rest.   Regarding history of urinary tract infections, no urinary tract infection was found during her stay in the hospital time.   TIME SPENT: 40 minutes. ____________________________ Katharina Caper, MD rv:slb D: 11/20/2011 11:07:52 ET T: 11/20/2011 11:50:55 ET JOB#: [id number]  cc: Katharina Caper, MD, <Dictator> Leo Grosser, MD Emmalene Kattner MD ELECTRONICALLY SIGNED 11/23/2011 12:01

## 2015-01-27 NOTE — H&P (Signed)
PATIENT NAME:  Dana Acosta, Dana Acosta MR#:  [id number] DATE OF BIRTH:  October 19, 1928  DATE OF ADMISSION:  11/13/2011  REFERRING PHYSICIAN:  Dr Enedina FinnerGoli.   PRIMARY CARE PHYSICIAN: Dr. Lorie PhenixNancy Maloney.   CHIEF COMPLAINT: Hypotension.   HISTORY OF PRESENT ILLNESS: The patient is an 81 year old African American female with past medical history of dysphagia, status post PEG tube placement, duodenal ulcer, dementia, hypertension, prior urinary tract infection with Enterococcus faecalis, hydrocephalus, recent G-tube placement on 11/08/2011 who presented to [facility] with reported hypotension. The patient is unable to provide any history at this point in time. The patient's daughter is currently at the bedside and provides all the history. The patient's daughter states that she was notified that her mother's blood pressure had fallen earlier in the day. It subsequently increased. However later in the day decreased again. She was then sent to [facility] for further evaluation. Initial blood pressure upon presentation here was found to be 94/52. Blood pressure has been as low as 67/39, however. She was also found to be profoundly hypoglycemic with a presenting blood glucose of 29. On routine chemistry panel, blood glucose was found to be 34. After D50 her blood glucose came up to 87. The patient also appears to be a bit anemic with a hemoglobin of 8.5 and a hematocrit 26.1.   PAST MEDICAL HISTORY:  1. Dysphagia, status post PEG tube placement on 04/30/2009 with replacement on 11/08/2011.  2. History of duodenal ulcer.  3. Alzheimer dementia, which is advanced.  4. Hypertension.  5. Prior urinary tract infection with Enterococcus faecalis.  6. Hydrocephalus.  7. Malnutrition.  8. Generalized debility.  9. Bedridden status x9 years.   ALLERGIES: No known drug allergies.    MEDICATIONS AT THE NURSING HOME:  1. Lisinopril 40 mg p.o. daily.  2. Norvasc 10 mg daily.  3. D3-50  one capsule p.o. monthly.  4. Aricept 10 mg p.o. at bedtime.  5. Catapres-TTS 0.2 mg per 24 hour patch once a week.  6. Vitamin C 500 mg daily.  7. Thera Plus oral liquid 5 mL p.o. daily.  8. Levaquin 500 mg p.o. q.24 hours.  9. Cefixime 400 mg p.o. daily x10 days.   SOCIAL HISTORY: The patient has been residing at [facility] for the past 12 years. She has 2 children. No reported tobacco, alcohol, or illicit drug use. Her healthcare power of attorney is Nickie RetortJohn Hornung who is her son.   FAMILY HISTORY: Mother died at age 79 secondary to infected decubitus ulcer. The patient's father died secondary to unspecified renal cancer.   REVIEW OF SYSTEMS: Currently unobtainable from the patient.   PHYSICAL EXAMINATION:  VITAL SIGNS: Temperature 96.8, pulse 62, respirations 18, blood pressure 75/52.   GENERAL: General physical examination reveals an elderly African American female who appears critically ill at this point in time.   HEENT: Normocephalic, atraumatic. The patient's eyes are closed but can be opened during the examination. Pupils are 3 mm bilaterally and sluggish to react. Oral mucosa is noted to be quite dry.   NECK: Supple without JVD or lymphadenopathy.   LUNGS: Lungs demonstrate rhonchi bilaterally with normal respiratory effort.   HEART: S1, S2, regular rate and rhythm. No murmurs or rubs appreciated.   ABDOMEN: Soft, nontender, nondistended. Bowel sounds positive. No rebound or guarding. No gross organomegaly appreciated. There is a PEG tube in place and there is no drainage noted from the PEG tube.   EXTREMITIES: 2+ bilateral lower extremity edema  noted. No cyanosis or clubbing.   NEUROLOGIC: The patient is quite lethargic at this point in time but is arousable. She is not following any commands. She has contractures of her upper extremities.   SKIN: Warm and dry. Decreased skin turgor noted.   GU: No suprapubic tenderness is noted at this point in time.   LABORATORY  DATA: CBC shows WBC 6.9, hemoglobin 8.5, hematocrit 26, platelets 135,000.  Complete metabolic panel shows sodium 135, potassium 5.5, chloride 101, CO2 of 19, BUN 90, creatinine 5.5, glucose 34. Alkaline phosphatase 57, ALT 24, AST 34, total protein 6.7, albumin 3.1.  INR 1.2. Presenting blood glucose was 29.   IMPRESSION: This is an 81 year old African American female with past medical history of dysphagia, duodenal ulcer, dementia, hypertension, prior urinary tract infection with Enterococcus faecalis, status post PEG tube replacement on 11/08/2011, hydrocephalus who presents to [facility] with lethargy, altered mental status, hypoglycemia, acute renal failure, hyperkalemia, metabolic acidosis, hypotension.   1. Hypoglycemia. The patient was found to have profound hypoglycemia upon admission. The cause of the hypoglycemia is unclear; however, underlying infection is a significant possibility. We will proceed with blood and urine cultures for further evaluation. We will also obtain a baseline cortisol. We will start the patient on D5 normal saline at 150 mL per hour, and we will continue to monitor blood glucose over time.  2. Acute renal failure. This appears to be quite severe as well. BUN is currently 90 with a creatinine of 5.5. This is significantly above her baseline. She most likely has underlying acute tubular necrosis. We will proceed with IV fluid hydration as well as a renal ultrasound. We will also obtain urine electrolytes. We will also place a Foley catheter. I had a preliminary discussion with the family about the potential need for renal replacement therapy; however, it appears that the family is not favoring this at this point in time.  3. Hyperkalemia. Serum potassium elevated at 5.5. This is secondary to acute renal failure and underlying metabolic acidosis. As above we are starting the patient on IV fluid hydration for now in hopes that serum potassium will come down a  bit. We will continue to follow serum potassium closely.  4. Metabolic acidosis. Serum bicarbonate was noted to be low at 19. This is likely secondary to acute renal failure. We will check an ABG with lactic acid. We will hold off on bicarbonate administration at this time.  5. Suspected sepsis. The patient has hypotension, hypoglycemia, and acute renal failure. Highly suspect underlying sepsis. The patient had prior urinary tract infections with both Proteus mirabilis as well as enterococcus. We will start the patient on broad-spectrum antibiotics with vancomycin and Zosyn. We will have Pharmacy to dose this.  6. Anemia, not otherwise specified. Unclear what has led to the patient's progressive anemia. We will check SPEP, UPEP, iron studies, and reticulocyte count.  7. Altered mental status. Likely secondary to hypoglycemia. However, we will search for other possibilities with CT scan of the head.   CODE STATUS: AT PRESENT, THE PATIENT'S HEALTHCARE POWER OF ATTORNEY, JOHN Formosa, A FULL CODE. HOWEVER, THIS IS SUBJECT TO CHANGE BASED UPON THE PATIENT'S CONDITION AND PROGRESSION.   TIME SPENT: Over 1 hour.     ____________________________ Lennox Pippins, MD mnl:vtd D: 11/13/2011 21:44:24 ET T: 11/14/2011 07:36:01 ET JOB#: [id number]  cc: Lennox Pippins, MD, <Dictator> Leo Grosser, MD Lennox Pippins MD ELECTRONICALLY SIGNED 12/20/2011 23:56

## 2015-01-27 NOTE — Op Note (Signed)
PATIENT NAME:  Dana FieldsETERSON, [name]:  [id number] DATE OF BIRTH:  [birth year]  DATE OF PROCEDURE:  11/08/2011  PREOPERATIVE DIAGNOSIS: Cerebrovascular accident.   POSTOPERATIVE DIAGNOSIS: Cerebrovascular accident.  PROCEDURE: Replacement of gastrostomy tube.  SURGEON: Quentin Orealph L. Ely, MD  ANESTHESIA: None.  INDICATION: The patient was returned to the emergency room following dislodgment of her 217 month old gastrostomy tube. She has had a stroke, is unable to swallow, and can easily dehydrate. She was returned to the emergency room for replacement of the gastrostomy tube.   DESCRIPTION OF PROCEDURE: The area was prepped with Betadine and draped with sterile towels. A wire was passed into the stomach over the stilet without difficulty. The stilet was removed and then the insertion site progressively dilated to 20 [place]. An 9018 French tube was then inserted without difficulty. The balloon was inflated and secured in position. The wounds were dressed sterilely, and the patient will return to the nursing home.  ____________________________ Carmie Endalph L. Ely III, MD rle:slb D: 11/08/2011 13:04:44 ET T: 11/08/2011 13:42:25 ET JOB#: [id number]  cc: Carmie Endalph L. Ely III, MD, <Dictator> Neomia Dearavid N. Harrington Challengerhies, MD Quentin OreALPH L ELY MD ELECTRONICALLY SIGNED 11/15/2011 21:14

## 2015-01-27 NOTE — Discharge Summary (Signed)
PATIENT NAME:  Dana FieldsETERSON, Davion MR#:  [id number] DATE OF BIRTH:  1929-01-06  DATE OF ADMISSION:  11/13/2011 DATE OF DISCHARGE:    ADDENDUM: Apparently the patient's family, the patient's son, who is power of attorney, discussed with social workers as well as palliative care discontinuation of the patient's tube feeds, and he confirmed that today. He does not want the patient to continue tube feeds anymore. Ms. Juarbe's tube feeds will be discontinued and her diet will be as tolerated only. Her medications should be given also through the PEG tube. She will be discharged to a skilled nursing facility with hospice care.   TIME SPENT: Additional 15 minutes.  ____________________________ Katharina Caperima Luverne Farone, MD rv:slb D: 11/20/2011 13:36:41 ET T: 11/20/2011 13:43:22 ET JOB#: [id number]  cc: Katharina Caperima Ammar Moffatt, MD, <Dictator> Leo GrosserNancy J. Maloney, MD Iviana Blasingame MD ELECTRONICALLY SIGNED 11/23/2011 12:01

## 2015-01-27 NOTE — Consult Note (Signed)
PATIENT NAME:  Dana Acosta, Dana Acosta MR#:  [id number] DATE OF BIRTH:  1929-02-26  DATE OF CONSULTATION:  11/18/2011  REFERRING PHYSICIAN:  Dr. Cherylann RatelLateef CONSULTING PHYSICIAN:  Knute Neuobert G. Lorre NickGittin, MD  CONSULTING PHYSICIAN: Mrs. Vonita Mosseterson is an 81 year old patient who was admitted to the Critical Care Unit on 11/13/2011, with acute renal failure, hypotension, hypoglycemic, also with anemia with hemoglobin of 8.5 and was admitted and treated in the Intensive Care Unit with renal failure, hyperkalemia, metabolic acidosis, and clinical picture of sepsis, and did not find the specific organism. The patient was treated initially with vancomycin and Zosyn. Some studies to look at anemia were obtained and the patient was also given glucose replacement. The patient was followed by Medicine as well as Nephrology. The patient's vital signs are somewhat improved, and the patient's renal function was also improved; creatinine today is 2.39. The patient has mild persistent anemia with hemoglobin in the range of 8 grams, transient thrombocytopenia is better, there is neutrophil count that is also unremarkable, a number of studies have been evaluated for anemia, the patient's mental status is improved somewhat, and with the demonstration of some abnormal serum free light chains hematology evaluation is requested.   The history is taken from the chart and the notes of other providers and includes dysphagia status post Peg placement, duodenal ulcer, hydrocephalus, dementia, hypertension, prior urinary tract infections with Enterococcus faecalis, and general debility.  DRUG ALLERGIES: No known drug allergies.   ADMISSION MEDICATIONS: Lisinopril, Norvasc, Aricept, Catapres, vitamin C, Thera-Plus, Levaquin, and Cefixime orally.  SOCIAL HISTORY: She has been living at [facility] for the past 12 years. Her healthcare power of attorney is her son, Nickie RetortJohn Pontillo.   FAMILY HISTORY: History apparently includes renal cancer.   REVIEW  OF SYSTEMS: At the time of admission and currently is still unobtainable.     PHYSICAL EXAMINATION:   GENERAL: The patient responds to stimuli, but otherwise does not respond meaningfully or respond to voice or follow commands.   MUSCULOSKELETAL: Her upper extremities are in a contracted posture across her chest.   VITALS: Blood pressure is normal.   LYMPH: No lymph nodes palpable in the neck.   LUNGS:  No wheezing and there is no respiratory distress.   ABDOMEN: Firm, not tender, no palpable mass.   HEART: Regular.   EXTREMITIES: Trace lower extremity edema.   SKIN: There is no rash.   NEUROLOGIC: Not responsive as above.   LABORATORY DATA: Creatinine today is 2.39. Hemoglobin is 8.3. I do not have documentation of the patient's baseline. Recent ferritin is in the normal range, at 82. Serum iron is slightly low with low saturation. No definite infective organism but potentially infection could give false low serum iron so compatible with but not diagnostic iron deficiency. The protein electrophoresis in the urine and the serum are negative. Serum free light chain ratio is slightly abnormal and there are some elevations of serum free light chains. The liver chemistries from 11/14/2011 are unremarkable. Albumin was 3.0.   There is no stool guaiac result.   ASSESSMENT AND RECOMMENDATIONS: From a hematology point of view, the patient has anemia in the range of 8 grams. This is compatible with chronic disease and with the azotemia. There is no definite diagnosis, but findings are compatible with possible iron deficiency. Electrophoresis results with light chains in the urine are slightly abnormal, but very nonspecific and nondiagnostic. It would normally call for repeat in two months or electrophoresis every 3 to 4 months. The patient most  likely therefore has chronic anemia and anemia of azotemia. Could have a myelodysplasia. Potentially could have iron deficiency. No demonstrated  bleeding. Stool guaiacs have not been checked. However, the patient does not appear a candidate for aggressive or invasive evaluation of the gastrointestinal tract and no blood loss has been demonstrated.   I recommend following the CBC in a week and then at intervals, could repeat the iron studies in a week, could supplement iron, and would repeat the electrophoretic studies in 2 to 3 months, and depending on the patient's condition and agreement to which family is pursuing aggressive treatments for symptomatic anemia, the patient could have interval transfusion or Procrit injections. There is nothing else to add acutely from hematology.  ____________________________ Knute Neu Lorre Nick, MD rgg:slb D: 11/19/2011 00:16:32 ET T: 11/19/2011 09:53:59 ET JOB#: [id number]  cc: Knute Neu. Lorre Nick, MD, <Dictator> Marin Roberts MD ELECTRONICALLY SIGNED 11/30/2011 14:01
